# Patient Record
Sex: Female | Born: 1950 | Race: White | Hispanic: No | Marital: Single | Smoking: Former smoker
Health system: Southern US, Community
[De-identification: ages and names within clinical notes are randomized; demographics above are authoritative.]

## PROBLEM LIST (undated history)

## (undated) DIAGNOSIS — K2270 Barrett's esophagus without dysplasia: Secondary | ICD-10-CM

## (undated) DIAGNOSIS — F329 Major depressive disorder, single episode, unspecified: Secondary | ICD-10-CM

## (undated) DIAGNOSIS — F32A Depression, unspecified: Secondary | ICD-10-CM

## (undated) DIAGNOSIS — F419 Anxiety disorder, unspecified: Secondary | ICD-10-CM

## (undated) DIAGNOSIS — K219 Gastro-esophageal reflux disease without esophagitis: Secondary | ICD-10-CM

## (undated) DIAGNOSIS — M1990 Unspecified osteoarthritis, unspecified site: Secondary | ICD-10-CM

## (undated) DIAGNOSIS — G500 Trigeminal neuralgia: Secondary | ICD-10-CM

## (undated) DIAGNOSIS — T7840XA Allergy, unspecified, initial encounter: Secondary | ICD-10-CM

## (undated) HISTORY — DX: Barrett's esophagus without dysplasia: K22.70

## (undated) HISTORY — PX: UPPER GASTROINTESTINAL ENDOSCOPY: SHX188

## (undated) HISTORY — DX: Major depressive disorder, single episode, unspecified: F32.9

## (undated) HISTORY — PX: POLYPECTOMY: SHX149

## (undated) HISTORY — DX: Allergy, unspecified, initial encounter: T78.40XA

## (undated) HISTORY — DX: Unspecified osteoarthritis, unspecified site: M19.90

## (undated) HISTORY — PX: CARPAL TUNNEL RELEASE: SHX101

## (undated) HISTORY — PX: COLONOSCOPY: SHX174

## (undated) HISTORY — PX: OTHER SURGICAL HISTORY: SHX169

## (undated) HISTORY — DX: Anxiety disorder, unspecified: F41.9

## (undated) HISTORY — DX: Trigeminal neuralgia: G50.0

## (undated) HISTORY — DX: Gastro-esophageal reflux disease without esophagitis: K21.9

## (undated) HISTORY — DX: Depression, unspecified: F32.A

---

## 1999-04-28 ENCOUNTER — Other Ambulatory Visit: Admission: RE | Admit: 1999-04-28 | Discharge: 1999-04-28 | Payer: Self-pay | Admitting: Obstetrics and Gynecology

## 1999-06-24 ENCOUNTER — Encounter: Admission: RE | Admit: 1999-06-24 | Discharge: 1999-06-24 | Payer: Self-pay | Admitting: Obstetrics and Gynecology

## 1999-06-24 ENCOUNTER — Encounter: Payer: Self-pay | Admitting: Obstetrics and Gynecology

## 2000-05-02 ENCOUNTER — Other Ambulatory Visit: Admission: RE | Admit: 2000-05-02 | Discharge: 2000-05-02 | Payer: Self-pay | Admitting: Obstetrics and Gynecology

## 2000-06-28 ENCOUNTER — Encounter: Admission: RE | Admit: 2000-06-28 | Discharge: 2000-06-28 | Payer: Self-pay | Admitting: Obstetrics and Gynecology

## 2000-06-28 ENCOUNTER — Encounter: Payer: Self-pay | Admitting: Obstetrics and Gynecology

## 2001-07-04 ENCOUNTER — Encounter: Admission: RE | Admit: 2001-07-04 | Discharge: 2001-07-04 | Payer: Self-pay | Admitting: Obstetrics and Gynecology

## 2001-07-04 ENCOUNTER — Encounter: Payer: Self-pay | Admitting: Obstetrics and Gynecology

## 2002-07-22 ENCOUNTER — Encounter: Admission: RE | Admit: 2002-07-22 | Discharge: 2002-07-22 | Payer: Self-pay | Admitting: Obstetrics and Gynecology

## 2002-07-22 ENCOUNTER — Encounter: Payer: Self-pay | Admitting: Obstetrics and Gynecology

## 2003-07-24 ENCOUNTER — Encounter: Admission: RE | Admit: 2003-07-24 | Discharge: 2003-07-24 | Payer: Self-pay | Admitting: Obstetrics and Gynecology

## 2004-05-25 ENCOUNTER — Other Ambulatory Visit: Admission: RE | Admit: 2004-05-25 | Discharge: 2004-05-25 | Payer: Self-pay | Admitting: Internal Medicine

## 2004-08-16 ENCOUNTER — Encounter: Admission: RE | Admit: 2004-08-16 | Discharge: 2004-08-16 | Payer: Self-pay | Admitting: Obstetrics and Gynecology

## 2005-05-30 ENCOUNTER — Other Ambulatory Visit: Admission: RE | Admit: 2005-05-30 | Discharge: 2005-05-30 | Payer: Self-pay | Admitting: Internal Medicine

## 2005-08-11 ENCOUNTER — Ambulatory Visit: Payer: Self-pay | Admitting: Internal Medicine

## 2005-09-26 ENCOUNTER — Ambulatory Visit: Payer: Self-pay | Admitting: Internal Medicine

## 2005-09-26 ENCOUNTER — Encounter (INDEPENDENT_AMBULATORY_CARE_PROVIDER_SITE_OTHER): Payer: Self-pay | Admitting: *Deleted

## 2005-11-04 ENCOUNTER — Ambulatory Visit: Payer: Self-pay | Admitting: Internal Medicine

## 2006-10-23 ENCOUNTER — Encounter: Admission: RE | Admit: 2006-10-23 | Discharge: 2006-10-23 | Payer: Self-pay | Admitting: Family Medicine

## 2007-07-01 ENCOUNTER — Emergency Department: Payer: Self-pay | Admitting: Internal Medicine

## 2007-12-24 ENCOUNTER — Encounter: Admission: RE | Admit: 2007-12-24 | Discharge: 2007-12-24 | Payer: Self-pay | Admitting: Family Medicine

## 2009-03-27 DIAGNOSIS — E785 Hyperlipidemia, unspecified: Secondary | ICD-10-CM

## 2009-03-27 DIAGNOSIS — F329 Major depressive disorder, single episode, unspecified: Secondary | ICD-10-CM | POA: Insufficient documentation

## 2009-03-27 DIAGNOSIS — K219 Gastro-esophageal reflux disease without esophagitis: Secondary | ICD-10-CM

## 2009-03-27 DIAGNOSIS — Z860100 Personal history of colon polyps, unspecified: Secondary | ICD-10-CM | POA: Insufficient documentation

## 2009-03-27 DIAGNOSIS — K5730 Diverticulosis of large intestine without perforation or abscess without bleeding: Secondary | ICD-10-CM | POA: Insufficient documentation

## 2009-03-27 DIAGNOSIS — K2270 Barrett's esophagus without dysplasia: Secondary | ICD-10-CM

## 2009-04-02 ENCOUNTER — Ambulatory Visit: Payer: Self-pay | Admitting: Internal Medicine

## 2009-04-03 ENCOUNTER — Encounter: Admission: RE | Admit: 2009-04-03 | Discharge: 2009-04-03 | Payer: Self-pay | Admitting: Family Medicine

## 2009-04-08 ENCOUNTER — Telehealth: Payer: Self-pay | Admitting: Internal Medicine

## 2009-04-08 ENCOUNTER — Ambulatory Visit (HOSPITAL_COMMUNITY): Admission: RE | Admit: 2009-04-08 | Discharge: 2009-04-08 | Payer: Self-pay | Admitting: Internal Medicine

## 2009-04-10 ENCOUNTER — Ambulatory Visit: Payer: Self-pay | Admitting: Internal Medicine

## 2009-04-20 ENCOUNTER — Ambulatory Visit: Payer: Self-pay | Admitting: Internal Medicine

## 2009-04-23 ENCOUNTER — Ambulatory Visit: Payer: Self-pay | Admitting: Internal Medicine

## 2009-04-23 ENCOUNTER — Encounter: Payer: Self-pay | Admitting: Internal Medicine

## 2009-04-24 ENCOUNTER — Encounter: Payer: Self-pay | Admitting: Internal Medicine

## 2009-09-21 ENCOUNTER — Other Ambulatory Visit: Admission: RE | Admit: 2009-09-21 | Discharge: 2009-09-21 | Payer: Self-pay | Admitting: Family Medicine

## 2010-07-21 ENCOUNTER — Encounter
Admission: RE | Admit: 2010-07-21 | Discharge: 2010-07-21 | Payer: Self-pay | Source: Home / Self Care | Attending: Family Medicine | Admitting: Family Medicine

## 2010-10-27 ENCOUNTER — Encounter: Payer: Self-pay | Admitting: Internal Medicine

## 2010-11-04 NOTE — Letter (Signed)
Summary: Endo/Colon Letter  [facility]  [address] [place], [place] [address]   Phone: [phone]  Fax: [phone]      October 27, 2010 MRN: [id number]   [facility] [address] [place], [place]  [address]   Dear Ms. DUFFY,   According to your medical record, it is time for you to schedule an Endoscopy/Colonoscopy . Endoscopic screeening is recommended for patients with certain upper digestive tract conditions because of associated increased risk for cancers of the upper digestive system. The American Cancer Society recommends Colonoscopy as a method to detect early colon cancer. Patients with a family history of colon cancer, or a personal history of colon polyps or inflammatory bowel disease are at increased risk.  This letter has been generated based on the recommendations made at the time of your prior procedure. If you feel that in your particular situation this may no longer apply, please contact our office.  Please call our office at [phone] to schedule this appointment or to update your records at your earliest convenience.  Thank you for cooperating with [place] to provide you with the very best care possible.   Sincerely,  Hedwig Morton. Juanda Chance, M.D.  [facility] [phone]

## 2011-08-16 ENCOUNTER — Other Ambulatory Visit: Payer: Self-pay | Admitting: Family Medicine

## 2011-08-16 DIAGNOSIS — Z1231 Encounter for screening mammogram for malignant neoplasm of breast: Secondary | ICD-10-CM

## 2011-08-26 ENCOUNTER — Ambulatory Visit: Payer: Self-pay

## 2011-09-02 ENCOUNTER — Ambulatory Visit
Admission: RE | Admit: 2011-09-02 | Discharge: 2011-09-02 | Disposition: A | Discharge status: Home / Self Care | Payer: 59 | Source: Ambulatory Visit | Attending: Family Medicine | Admitting: Family Medicine

## 2011-09-02 DIAGNOSIS — Z1231 Encounter for screening mammogram for malignant neoplasm of breast: Secondary | ICD-10-CM

## 2011-10-03 ENCOUNTER — Encounter: Payer: Self-pay | Admitting: Internal Medicine

## 2011-11-11 ENCOUNTER — Encounter: Payer: 59 | Admitting: Internal Medicine

## 2011-12-21 ENCOUNTER — Encounter: Payer: 59 | Admitting: Internal Medicine

## 2012-01-10 ENCOUNTER — Ambulatory Visit (AMBULATORY_SURGERY_CENTER): Payer: 59

## 2012-01-10 ENCOUNTER — Encounter: Payer: Self-pay | Admitting: Internal Medicine

## 2012-01-10 VITALS — Ht 66.0 in | Wt 214.3 lb

## 2012-01-10 DIAGNOSIS — Z1211 Encounter for screening for malignant neoplasm of colon: Secondary | ICD-10-CM

## 2012-01-10 DIAGNOSIS — Z86010 Personal history of colonic polyps: Secondary | ICD-10-CM

## 2012-01-10 MED ORDER — PEG-KCL-NACL-NASULF-NA ASC-C 100 G PO SOLR: 1.0000 | Freq: Once | ORAL | Status: AC

## 2012-01-24 ENCOUNTER — Encounter: Payer: 59 | Admitting: Internal Medicine

## 2012-03-28 ENCOUNTER — Telehealth: Payer: Self-pay | Admitting: Internal Medicine

## 2012-03-28 ENCOUNTER — Encounter: Payer: 59 | Admitting: Internal Medicine

## 2012-04-25 ENCOUNTER — Encounter: Payer: Self-pay | Admitting: Internal Medicine

## 2012-09-10 ENCOUNTER — Other Ambulatory Visit: Payer: Self-pay | Admitting: Family Medicine

## 2012-09-10 DIAGNOSIS — Z1231 Encounter for screening mammogram for malignant neoplasm of breast: Secondary | ICD-10-CM

## 2012-10-19 ENCOUNTER — Ambulatory Visit: Payer: 59

## 2012-11-22 ENCOUNTER — Encounter: Payer: Self-pay | Admitting: Internal Medicine

## 2013-02-01 ENCOUNTER — Encounter: Payer: Self-pay | Admitting: Neurology

## 2013-02-01 DIAGNOSIS — F329 Major depressive disorder, single episode, unspecified: Secondary | ICD-10-CM

## 2013-02-01 DIAGNOSIS — M1990 Unspecified osteoarthritis, unspecified site: Secondary | ICD-10-CM | POA: Insufficient documentation

## 2013-02-01 DIAGNOSIS — F419 Anxiety disorder, unspecified: Secondary | ICD-10-CM

## 2013-02-01 DIAGNOSIS — K219 Gastro-esophageal reflux disease without esophagitis: Secondary | ICD-10-CM | POA: Insufficient documentation

## 2013-02-04 ENCOUNTER — Encounter: Payer: Self-pay | Admitting: Neurology

## 2013-02-04 ENCOUNTER — Ambulatory Visit (INDEPENDENT_AMBULATORY_CARE_PROVIDER_SITE_OTHER): Payer: 59 | Admitting: Neurology

## 2013-02-04 VITALS — BP 135/85 | HR 68 | Ht 66.0 in | Wt 212.0 lb

## 2013-02-04 DIAGNOSIS — G500 Trigeminal neuralgia: Secondary | ICD-10-CM

## 2013-02-04 MED ORDER — CARBAMAZEPINE 200 MG PO TABS: 200.0000 mg | ORAL_TABLET | Freq: Two times a day (BID) | ORAL | Status: DC

## 2013-02-04 NOTE — Progress Notes (Signed)
[facility]  PATIENT: RASHAUNDA RAHL DOB: 05/09/1951  HISTORICAL Davey is a 62 years old right-handed Caucasian female, referred by her primary care physician Dr Maurice Small for evaluation of left trigeminal neuralgia.  She had a past medical history of hyperlipidemia, suffered left trigeminal neuralgia since 2008, she was treated at [facility] neurologist by Dr. Clint Lipps, she reported severe shooting pain along the left chin, in the left V3 distribution, initial episode lasted about one week, it was controlled by carbamazepine 100 mg twice a day, the pain was so severe, she presented to the emergency room   While she was taking carbamazepine100 mg twice a day, she had a second episodes of flareup 18 months later in 2009, her symptoms was be able to be quickly brought under control, once the carbamazepine dosage was increased to 200 mg twice a day,  She has been doing very well on current dose of carbamazepine regular form 200 mg twice a day, there was no recurrent symptoms, but because the severity of the pain, patient is concerned that it might come back   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 aspirin 81 MG tablet Take 81 mg by mouth daily.      . calcium-vitamin D (OSCAL WITH D) 500-200 MG-UNIT per tablet Take 2 tablets by mouth 2 (two) times daily.      . cetirizine (ZYRTEC) 10 MG tablet Take 10 mg by mouth daily.      Marland Kitchen escitalopram (LEXAPRO) 20 MG tablet Take 20 mg by mouth daily.      Marland Kitchen loratadine (CLARITIN) 10 MG tablet Take 10 mg by mouth daily.      . Multiple Vitamin  (MULTIVITAMIN) tablet Take 1 tablet by mouth daily.      Marland Kitchen omeprazole (PRILOSEC) 40 MG capsule Take 40 mg by mouth 2 (two) times daily.      . carbamazepine (TEGRETOL) 200 MG tablet Take 200 mg by mouth 2 (two) times daily.       No facility-administered medications prior to visit.    PAST MEDICAL HISTORY: Past Medical History  Diagnosis Date  . GERD (gastroesophageal reflux disease)   . Arthritis   . Trigeminal neuralgia     left side  . Barrett's esophagus   . Depression   . Anxiety     PAST SURGICAL HISTORY: Past Surgical History  Procedure Laterality Date  . Carpal tunnel release      right hand  . Colonoscopy    . Upper gastrointestinal endoscopy    .  bone spur surg       on left foot /also toe surg     FAMILY HISTORY: Family History  Problem Relation Age of Onset  . Heart disease Father   . Diabetes Father   . Lung cancer Father   . Glaucoma    . Arthritis    . Arrhythmia    . Fibromyalgia Sister   . Bipolar disorder Sister     SOCIAL HISTORY:  History   Social History  . Marital Status: Single    Spouse Name: N/A    Number of Children: 0  .  Years of Education: college   Occupational History  .  Lab [employer]   Social History Main Topics  . Smoking status: Former Smoker    Quit date: 01/09/1982  . Smokeless tobacco: Never Used  . Alcohol Use: 1.8 - 2.4 oz/week    3-4 Glasses of wine per week     Comment: Weekends  . Drug Use: No  . Sexually Active: Not on file   Other Topics Concern  . Not on file   Social History Narrative   Patient is single and lives with her dog.Patient works for [employer].   Caffeine. 2 cups daily.   Right handed.     PHYSICAL EXAM    Filed Vitals:   02/04/13 0826  BP: 135/85  Pulse: 68  Height: 5\' 6"  (1.676 m)  Weight: 212 lb (96.163 kg)    Not recorded    Body mass index is 34.23 kg/(m^2).   Generalized: In no acute distress  Neck: Supple, no carotid bruits   Cardiac: Regular rate  rhythm  Pulmonary: Clear to auscultation bilaterally  Musculoskeletal: No deformity  Neurological examination  Mentation: Alert oriented to time, place, history taking, and causual conversation  Cranial nerve II-XII: Pupils were equal round reactive to light extraocular movements were full, visual field were full on confrontational test. facial sensation and strength were normal. hearing was intact to finger rubbing bilaterally. Uvula tongue midline.  head turning and shoulder shrug and were normal and symmetric.Tongue protrusion into cheek strength was normal.  Motor: She has mild toe flexion, extension weakness.  Sensory: Intact to light touch, pinprick, vibratory sensation,  Coordination: Normal finger to nose, heel-to-shin bilaterally there was no truncal ataxia  Gait: Rising up from seated position without assistance, normal stance, without trunk ataxia, moderate stride, good arm swing, smooth turning, able to perform tiptoe, and heel walking without difficulty.   Romberg signs: positive.  Deep tendon reflexes: Brachioradialis 2/2, biceps 2/2, triceps 2/2, patellar 2/2, Achilles 2/2 plantar responses were flexor bilaterally.    ASSESSMENT AND PLAN   62 years old right-handed Caucasian female, with history of left trigeminal neuralgia, involving left V3 branch, Doing very well with current dose of carbamazepine 200 mg twice a day, will refill her medications, I also advised her, if she continues to be symptom-free, may consider tapering off carbamazepine, call office for recurrent symptoms,    Levert Feinstein, M.D. Ph.D.  [facility] [address], [address] [place], [place] [address] [phone]

## 2014-01-10 ENCOUNTER — Other Ambulatory Visit: Payer: Self-pay | Admitting: Family Medicine

## 2014-01-10 ENCOUNTER — Other Ambulatory Visit (HOSPITAL_COMMUNITY)
Admission: RE | Admit: 2014-01-10 | Discharge: 2014-01-10 | Disposition: A | Discharge status: Home / Self Care | Payer: 59 | Source: Ambulatory Visit | Attending: Family Medicine | Admitting: Family Medicine

## 2014-01-10 DIAGNOSIS — Z124 Encounter for screening for malignant neoplasm of cervix: Secondary | ICD-10-CM | POA: Insufficient documentation

## 2014-01-14 LAB — CYTOLOGY - PAP

## 2014-05-27 ENCOUNTER — Other Ambulatory Visit: Payer: Self-pay

## 2014-05-27 DIAGNOSIS — Z1231 Encounter for screening mammogram for malignant neoplasm of breast: Secondary | ICD-10-CM

## 2014-06-12 ENCOUNTER — Emergency Department: Payer: Self-pay | Admitting: Internal Medicine

## 2014-06-20 ENCOUNTER — Ambulatory Visit: Payer: 59

## 2014-07-18 ENCOUNTER — Ambulatory Visit: Payer: 59

## 2014-08-06 ENCOUNTER — Ambulatory Visit: Payer: 59

## 2014-08-20 ENCOUNTER — Ambulatory Visit
Admission: RE | Admit: 2014-08-20 | Discharge: 2014-08-20 | Disposition: A | Discharge status: Home / Self Care | Payer: 59 | Source: Ambulatory Visit

## 2014-08-20 DIAGNOSIS — Z1231 Encounter for screening mammogram for malignant neoplasm of breast: Secondary | ICD-10-CM
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
## 2015-09-08 ENCOUNTER — Encounter: Payer: Self-pay | Admitting: Internal Medicine

## 2016-04-20 ENCOUNTER — Encounter: Payer: Self-pay | Admitting: Gastroenterology

## 2016-05-02 ENCOUNTER — Encounter: Payer: Self-pay | Admitting: Gastroenterology

## 2016-05-02 ENCOUNTER — Telehealth: Payer: Self-pay | Admitting: *Deleted

## 2016-05-02 ENCOUNTER — Ambulatory Visit (AMBULATORY_SURGERY_CENTER): Payer: Self-pay | Admitting: *Deleted

## 2016-05-02 VITALS — Ht 65.5 in | Wt 226.0 lb

## 2016-05-02 DIAGNOSIS — Z86010 Personal history of colonic polyps: Secondary | ICD-10-CM

## 2016-05-02 MED ORDER — NA SULFATE-K SULFATE-MG SULF 17.5-3.13-1.6 GM/177ML PO SOLN: 1.0000 | Freq: Once | ORAL | 0 refills | Status: AC

## 2016-05-02 NOTE — Telephone Encounter (Signed)
Dr Myrtie Neitheranis,  I saw this lady today in [place]. She is scheduled for a colon with you 10-9- Monday for a hx of colon polyps- prev. Brodie pt.  She also has a hx of Barrett's- she had her last egd 04-23-2009 with Barrett's with a 3 yr recall.  She was only scheduled for a recall colon.  She takes omeprazole and states no new issues . Do you want to add egd to her colon and change her date or would you like her to have an OV  Please advise, thanks for your time, Elizebeth BrookingMarie [place]

## 2016-05-02 NOTE — Progress Notes (Signed)
No egg or soy allergy known to patient  No issues with past sedation with any surgeries  or procedures, no intubation problems  No diet pills per patient No home 02 use per patient  No blood thinners per patient  Pt states  issues with constipation Daily-- doesnt use OTC meds -stools are soft at times, hard at other times- No A fib or A flutter

## 2016-05-02 NOTE — Telephone Encounter (Signed)
Yes, thank you for picking that up. I reviewed the EGD and pathology reports from 2010, and she does appear to have very short segment Barrett's esophagus.  If the schedule allows, please add an EGD to her planned procedures that day and inform her.

## 2016-05-03 NOTE — Telephone Encounter (Signed)
Informed pt egd added per Dr Myrtie Neitheranis-- per natalie, added egd to colon 05-16-16 at 1130, she blocked his 10 am so no additional proc then to add the egd for pt 10-9.  Pt aware  Elizebeth BrookingMarie [place]

## 2016-05-16 ENCOUNTER — Ambulatory Visit (AMBULATORY_SURGERY_CENTER): Payer: 59 | Admitting: Gastroenterology

## 2016-05-16 ENCOUNTER — Encounter: Payer: Self-pay | Admitting: Gastroenterology

## 2016-05-16 VITALS — BP 102/68 | HR 62 | Temp 97.7°F | Resp 14 | Ht 65.5 in | Wt 226.0 lb

## 2016-05-16 DIAGNOSIS — Z86010 Personal history of colonic polyps: Secondary | ICD-10-CM | POA: Diagnosis not present

## 2016-05-16 DIAGNOSIS — K2270 Barrett's esophagus without dysplasia: Secondary | ICD-10-CM | POA: Diagnosis not present

## 2016-05-16 MED ORDER — SODIUM CHLORIDE 0.9 % IV SOLN: 500.0000 mL | INTRAVENOUS | Status: AC

## 2016-05-16 NOTE — Progress Notes (Signed)
Called to room to assist during endoscopic procedure.  Patient ID and intended procedure confirmed with present staff. Received instructions for my participation in the procedure from the performing physician.  

## 2016-05-16 NOTE — Op Note (Signed)
[facility] Patient Name: Destiny Wilson Procedure Date: 05/16/2016 11:03 AM MRN: [id number] Endoscopist: Sherilyn Cooter L. Myrtie Neither , MD Age: 65 Referring MD:  Date of Birth: 01-27-1951 Gender: Female Account #: [id number] Procedure:                Colonoscopy Indications:              Surveillance: Personal history of adenomatous                            polyps on last colonoscopy 09/2005 Medicines:                Monitored Anesthesia Care Procedure:                Pre-Anesthesia Assessment:                           - Prior to the procedure, a History and Physical                            was performed, and patient medications and                            allergies were reviewed. The patient's tolerance of                            previous anesthesia was also reviewed. The risks                            and benefits of the procedure and the sedation                            options and risks were discussed with the patient.                            All questions were answered, and informed consent                            was obtained. Anticoagulants: The patient has taken                            aspirin. It was decided not to withhold this                            medication prior to the procedure. ASA Grade                            Assessment: II - A patient with mild systemic                            disease. After reviewing the risks and benefits,                            the patient was deemed in satisfactory condition to  undergo the procedure.                           - Prior to the procedure, a History and Physical                            was performed, and patient medications and                            allergies were reviewed. The patient's tolerance of                            previous anesthesia was also reviewed. The risks                            and benefits of the procedure and the sedation             options and risks were discussed with the patient.                            All questions were answered, and informed consent                            was obtained. Anticoagulants: The patient has taken                            aspirin. It was decided not to withhold this                            medication prior to the procedure. ASA Grade                            Assessment: II - A patient with mild systemic                            disease. After reviewing the risks and benefits,                            the patient was deemed in satisfactory condition to                            undergo the procedure.                           After obtaining informed consent, the colonoscope                            was passed under direct vision. Throughout the                            procedure, the patient's blood pressure, pulse, and                            oxygen saturations were monitored continuously. The  Model CF-[id number]) scope was introduced                            through the anus and advanced to the the cecum,                            identified by appendiceal orifice and ileocecal                            valve. The colonoscopy was performed without                            difficulty. The patient tolerated the procedure                            well. The quality of the bowel preparation was                            excellent. The ileocecal valve, appendiceal                            orifice, and rectum were photographed. The quality                            of the bowel preparation was evaluated using the                            BBPS [facility]([place] Bowel Preparation Scale) with scores                            of: Right Colon = 2, Transverse Colon = 3 and Left                            Colon = 3. The total BBPS score equals 8. The bowel                            preparation used was SUPREP. Scope In:  11:25:27 AM Scope Out: 11:44:55 AM Scope Withdrawal Time: 0 hours 8 minutes 43 seconds  Total Procedure Duration: 0 hours 19 minutes 28 seconds  Findings:                 The perianal and digital rectal examinations were                            normal.                           Many large-mouthed diverticula were found in the                            entire colon.                           The exam was otherwise without abnormality on  direct and retroflexion views. Complications:            No immediate complications. Estimated Blood Loss:     Estimated blood loss: none. Impression:               - Diverticulosis in the entire examined colon.                           - The examination was otherwise normal on direct                            and retroflexion views.                           - No specimens collected. Recommendation:           - Patient has a contact number available for                            emergencies. The signs and symptoms of potential                            delayed complications were discussed with the                            patient. Return to normal activities tomorrow.                            Written discharge instructions were provided to the                            patient.                           - Resume previous diet.                           - Continue present medications.                           - Repeat colonoscopy in 10 years for surveillance. Henry L. Myrtie Neither, MD 05/16/2016 11:53:52 AM This report has been signed electronically.

## 2016-05-16 NOTE — Progress Notes (Signed)
Pt ate 2 eggs and 1/2 a piece of toast yesterday am at 0600.  maw

## 2016-05-16 NOTE — Op Note (Signed)
[facility] Patient Name: Destiny Wilson Procedure Date: 05/16/2016 11:03 AM MRN: [id number] Endoscopist: Sherilyn Cooter L. Myrtie Neither , MD Age: 65 Referring MD:  Date of Birth: 05/18/1951 Gender: Female Account #: [id number] Procedure:                Upper GI endoscopy Indications:              Surveillance for malignancy due to personal history                            of Barrett's esophagus Medicines:                Monitored Anesthesia Care Procedure:                Pre-Anesthesia Assessment:                           - Prior to the procedure, a History and Physical                            was performed, and patient medications and                            allergies were reviewed. The patient's tolerance of                            previous anesthesia was also reviewed. The risks                            and benefits of the procedure and the sedation                            options and risks were discussed with the patient.                            All questions were answered, and informed consent                            was obtained. Anticoagulants: The patient has taken                            aspirin. It was decided not to withhold this                            medication prior to the procedure. ASA Grade                            Assessment: II - A patient with mild systemic                            disease. After reviewing the risks and benefits,                            the patient was deemed in satisfactory condition to  undergo the procedure.                           After obtaining informed consent, the endoscope was                            passed under direct vision. Throughout the                            procedure, the patient's blood pressure, pulse, and                            oxygen saturations were monitored continuously. The                            Model GIF-HQ190 [id number](SN#[id number]) scope was introduced                        through the mouth, and advanced to the second part                            of duodenum. The upper GI endoscopy was                            accomplished without difficulty. The patient                            tolerated the procedure well. Scope In: Scope Out: Findings:                 There were esophageal mucosal changes secondary to                            established short-segment Barrett's disease present                            in the lower third of the esophagus. The maximum                            longitudinal extent of these mucosal changes was 1                            cm in length. Mucosa was biopsied with a cold                            forceps for histology in a targeted manner in the                            lower third of the esophagus. One specimen bottle                            was sent to pathology.                           A few sessile fundic gland polyps with  no stigmata                            of recent bleeding were found in the gastric fundus                            and on the greater curvature of the gastric body.                           The cardia and gastric fundus were normal on                            retroflexion.                           The examined duodenum was normal. Complications:            No immediate complications. Estimated Blood Loss:     Estimated blood loss: none. Impression:               - Esophageal mucosal changes secondary to                            established short-segment Barrett's disease.                            Biopsied.                           - A few fundic gland polyps. Benign and commonly                            seen with chronic PPI therapy.                           - Normal examined duodenum. Recommendation:           - Patient has a contact number available for                            emergencies. The signs and symptoms of potential                             delayed complications were discussed with the                            patient. Return to normal activities tomorrow.                            Written discharge instructions were provided to the                            patient.                           - Resume previous diet.                           -  Continue present medications.                           - Await pathology results.                           - Repeat upper endoscopy for surveillance based on                            pathology results. [name] L. Myrtie Neither, MD 05/16/2016 11:50:32 AM This report has been signed electronically.

## 2016-05-16 NOTE — Progress Notes (Signed)
Pt was rubbing her right calf after she got dressed.  I asked if she was ok.  Pt replied, "my leg feels like it has a cramp in it".  No redness or swelling noted. maw

## 2016-05-16 NOTE — Patient Instructions (Signed)
Handouts given : Diverticulosis, Barrett's.   YOU HAD AN ENDOSCOPIC PROCEDURE TODAY AT THE [facility]:   Refer to the procedure report that was given to you for any specific questions about what was found during the examination.  If the procedure report does not answer your questions, please call your gastroenterologist to clarify.  If you requested that your care partner not be given the details of your procedure findings, then the procedure report has been included in a sealed envelope for you to review at your convenience later.  YOU SHOULD EXPECT: Some feelings of bloating in the abdomen. Passage of more gas than usual.  Walking can help get rid of the air that was put into your GI tract during the procedure and reduce the bloating. If you had a lower endoscopy (such as a colonoscopy or flexible sigmoidoscopy) you may notice spotting of blood in your stool or on the toilet paper. If you underwent a bowel prep for your procedure, you may not have a normal bowel movement for a few days.  Please Note:  You might notice some irritation and congestion in your nose or some drainage.  This is from the oxygen used during your procedure.  There is no need for concern and it should clear up in a day or so.  SYMPTOMS TO REPORT IMMEDIATELY:   Following lower endoscopy (colonoscopy or flexible sigmoidoscopy):  Excessive amounts of blood in the stool  Significant tenderness or worsening of abdominal pains  Swelling of the abdomen that is new, acute  Fever of 100F or higher   Following upper endoscopy (EGD)  Vomiting of blood or coffee ground material  New chest pain or pain under the shoulder blades  Painful or persistently difficult swallowing  New shortness of breath  Fever of 100F or higher  Black, tarry-looking stools  For urgent or emergent issues, a gastroenterologist can be reached at any hour by calling [phone].   DIET:  We do recommend a small meal at first, but  then you may proceed to your regular diet.  Drink plenty of fluids but you should avoid alcoholic beverages for 24 hours.  ACTIVITY:  You should plan to take it easy for the rest of today and you should NOT DRIVE or use heavy machinery until tomorrow (because of the sedation medicines used during the test).    FOLLOW UP: Our staff will call the number listed on your records the next business day following your procedure to check on you and address any questions or concerns that you may have regarding the information given to you following your procedure. If we do not reach you, we will leave a message.  However, if you are feeling well and you are not experiencing any problems, there is no need to return our call.  We will assume that you have returned to your regular daily activities without incident.  If any biopsies were taken you will be contacted by phone or by letter within the next 1-3 weeks.  Please call us at [phone] if you have not heard about the biopsies in 3 weeks.    SIGNATURES/CONFIDENTIALITY: You and/or your care partner have signed paperwork which will be entered into your electronic medical record.  These signatures attest to the fact that that the information above on your After Visit Summary has been reviewed and is understood.  Full responsibility of the confidentiality of this discharge information lies with you and/or your care-partner.

## 2016-05-16 NOTE — Progress Notes (Signed)
No problems noted in the recovery room. maw 

## 2016-05-16 NOTE — Progress Notes (Signed)
Report to PACU, RN, vss, BBS= Clear.  

## 2016-05-17 ENCOUNTER — Telehealth: Payer: Self-pay

## 2016-05-17 NOTE — Telephone Encounter (Signed)
Left a message at [phone] for the pt to call us back if she has any questions or concerns. maw

## 2016-05-17 NOTE — Telephone Encounter (Signed)
  Follow up Call-  Call back number 05/16/2016  Post procedure Call Back phone  # #[phone]  Permission to leave phone message Yes  Some recent data might be hidden    Patient was called for follow up after her procedure on 05/16/2016. No answer at the number given for follow up phone call. A message was left on the answering machine. This was the second attempt to contact the patient.

## 2016-05-20 ENCOUNTER — Encounter: Payer: Self-pay | Admitting: Gastroenterology

## 2017-04-17 ENCOUNTER — Ambulatory Visit: Payer: 59 | Admitting: Podiatry

## 2017-04-20 ENCOUNTER — Encounter: Payer: Self-pay | Admitting: Podiatry

## 2017-04-20 ENCOUNTER — Ambulatory Visit (INDEPENDENT_AMBULATORY_CARE_PROVIDER_SITE_OTHER): Payer: 59

## 2017-04-20 ENCOUNTER — Ambulatory Visit (INDEPENDENT_AMBULATORY_CARE_PROVIDER_SITE_OTHER): Payer: 59 | Admitting: Podiatry

## 2017-04-20 DIAGNOSIS — M779 Enthesopathy, unspecified: Secondary | ICD-10-CM

## 2017-04-20 DIAGNOSIS — M898X9 Other specified disorders of bone, unspecified site: Secondary | ICD-10-CM

## 2017-04-20 DIAGNOSIS — S93402A Sprain of unspecified ligament of left ankle, initial encounter: Secondary | ICD-10-CM

## 2017-04-20 DIAGNOSIS — M21629 Bunionette of unspecified foot: Secondary | ICD-10-CM

## 2017-04-20 DIAGNOSIS — M19172 Post-traumatic osteoarthritis, left ankle and foot: Secondary | ICD-10-CM

## 2017-04-20 NOTE — Progress Notes (Signed)
Subjective:    Patient ID: Destiny Wilson, female   DOB: 66 y.o.   MRN: [id number]   HPI 66 year old female percent the office today for multiple concerns. She states that the majority of her symptoms are to the right foot. She is a large bone spur on top of her right foot that should have cut out as well as for painful tailor's bunion the right foot. So than on ongoing issues she's tried numerous conservative treatments including shoe gear modifications, offloading and padding without any significant improvement and she is requesting surgical intervention for both these issues. She has a history of a previous exostectomy at the dorsal aspect of the left midfoot couple years back and she did well. She also states that she gets pain to the outside aspect of the left ankle which is been ongoing the last several months. She has a history of a ankle fracture in 2011 that did not require surgery. She gets some pain in the outside aspect of her ankle with walking but she is not having any pain today she is on steroids for her poison ivy. She has no other concerns today.   Review of Systems  All other systems reviewed and are negative.       Objective:  Physical Exam General: AAO x3, NAD  Dermatological: Skin is warm, dry and supple bilateral. Nails x 10 are well manicured; remaining integument appears unremarkable at this time. There are no open sores, no preulcerative lesions, no rash or signs of infection present.  Vascular: Dorsalis Pedis artery and Posterior Tibial artery pedal pulses are 2/4 bilateral with immedate capillary fill time. There is no pain with calf compression, swelling, warmth, erythema.   Neruologic: Grossly intact via light touch bilateral. Protective threshold with Semmes Wienstein monofilament intact to all pedal sites bilateral.   Musculoskeletal: On the lateral aspect of the ankle subjective she gets discomfort lateral ankle but there is no area of tenderness identified  today. There is no area pinpoint bony tenderness or pain the vibratory sensation. No pain on the lateral ankle ligaments, syndesmosis or deltoid ligaments. There is no pain of the fifth metatarsal base, talus. There is no swelling to the left ankle or foot. On the right side there is a large bony exostosis tenderness to palpation there is erythema from irritation shoes. There is a large prominence of the lateral aspect of the fifth metatarsal head consistent with a significant tailor's bunion there is again erythema from irritation shoes and is tenderness palpation in this area. There is cavus foot type identified bilaterally. There is no other area of tenderness of the foot bilaterally. MMT 5/5.  Gait: Unassisted, Nonantalgic.      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 On the right foot there is a large bony exostosis present off the first metatarsal cuneiform joint as well as tailor's bunion present. On the left ankle there is mild arthritis of the lateral ankle. There is no evidence of acute fracture identified.  1. Left lateral ankle pain -Results of mild arthritis as well as foot type. Recommend custom orthotics. She has inserts at home which help her foot. She had  no pain today she is on steroids. We'll continue with change in shoes as well as orthotics  2. Symptomatic dorsal exostosis right foot and tailor's bunion -When long discussion regards to both conservative and surgical treatment options. This point she wishes to proceed with surgical intervention. Discussed options and she states that she does not want to do a correction of the tailor's bunion she  wants to have the bone removed, fifth metatarsal head excision. She requested this procedure today. -Will do dorsal exostectomy with fifth metatarsal head excision right foot. -The incision placement as well as the postoperative course was discussed with the patient. I discussed risks of the surgery which include, but not limited to, infection, bleeding, pain, swelling, need for further surgery, transfer lesions, delayed or nonhealing, painful or ugly scar, numbness or sensation changes, over/under correction, recurrence, transfer lesions, further deformity, hardware failure, DVT/PE, loss of toe/foot. Patient understands these risks and wishes to proceed with surgery. The surgical consent was reviewed with the patient all 3 pages were signed. No promises or guarantees were given to the outcome of the procedure. All questions were answered to the best of my ability. Before the surgery the patient was encouraged to call the office if there is any further questions. [facility].  CAM boot dispensed for postop.  Ovid CurdMatthew Laela Deviney, DPM

## 2017-04-20 NOTE — Patient Instructions (Signed)

## 2017-04-26 ENCOUNTER — Encounter: Payer: Self-pay | Admitting: Podiatry

## 2017-04-26 DIAGNOSIS — M21541 Acquired clubfoot, right foot: Secondary | ICD-10-CM | POA: Diagnosis not present

## 2017-04-26 DIAGNOSIS — M25774 Osteophyte, right foot: Secondary | ICD-10-CM | POA: Diagnosis not present

## 2017-04-26 NOTE — Progress Notes (Signed)
Pre-operative Note  Patient presents to the [facility] today for surgical intervention of the right foot for surgical excision of a painful bone spur on the top of the foot and for a large, painful tailors bunion. The surgical consent was reviewed with the patient and we discussed the procedure as well as the postoperative course. Will plan for exostectomy and 5th metatarsal head excision. She states she did not want to undergo realignment of the metatarsal and she did not want plates or screws in her foot and she actually came in to the office requesting a metatarsal head excision. I again discussed all alternatives, risks, complications. I answered all of their questions to the best of my ability and they wish to proceed with surgery. No promises or guarantees were given as to the outcome of the surgery.   The surgical consent was signed.   Patient is NPO since midnight.  The patient does not have have a history of blood clots or bleeding disorders.   No further questions.   Ovid Curd, DPM [facility]

## 2017-04-28 ENCOUNTER — Telehealth: Payer: Self-pay | Admitting: *Deleted

## 2017-04-28 NOTE — Telephone Encounter (Signed)
Called and left a message to call back and was checking on patient after surgery. Misty Stanley

## 2017-05-01 ENCOUNTER — Encounter: Payer: Self-pay | Admitting: Podiatry

## 2017-05-01 ENCOUNTER — Ambulatory Visit (INDEPENDENT_AMBULATORY_CARE_PROVIDER_SITE_OTHER): Payer: 59

## 2017-05-01 ENCOUNTER — Ambulatory Visit (INDEPENDENT_AMBULATORY_CARE_PROVIDER_SITE_OTHER): Payer: 59 | Admitting: Podiatry

## 2017-05-01 DIAGNOSIS — M898X9 Other specified disorders of bone, unspecified site: Secondary | ICD-10-CM

## 2017-05-01 DIAGNOSIS — M21629 Bunionette of unspecified foot: Secondary | ICD-10-CM

## 2017-05-02 ENCOUNTER — Other Ambulatory Visit: Payer: Self-pay | Admitting: Family Medicine

## 2017-05-02 DIAGNOSIS — Z1231 Encounter for screening mammogram for malignant neoplasm of breast: Secondary | ICD-10-CM

## 2017-05-03 NOTE — Progress Notes (Signed)
Subjective: Destiny Wilson is a 66 y.o. is seen today in office s/p right foot dorsal exostectomy and 5th metatarsal head excision preformed on 04/26/2017 for POV #1. They state their pain is controlled and she is not taking any pain medication. She has remained in a cam boot. She has no concerns today. Denies any systemic complaints such as fevers, chills, nausea, vomiting. No calf pain, chest pain, shortness of breath.   Objective: General: No acute distress, AAOx3  DP/PT pulses palpable 2/4, CRT < 3 sec to all digits.  Protective sensation intact. Motor function intact.  Right foot: Incision is well coapted without any evidence of dehiscence and sutures are intact. There is no surrounding erythema, ascending cellulitis, fluctuance, crepitus, malodor, drainage/purulence. There is mild edema around the surgical site. There is no pain along the surgical site. There is significant improvement in the prominence that she was having prior to surgery she is happy with the outcome so far. No other areas of tenderness to bilateral lower extremities.  No other open lesions or pre-ulcerative lesions.  No pain with calf compression, swelling, warmth, erythema.   Assessment and Plan:  Status post right foot surgery, doing well with no complications   -Treatment options discussed including all alternatives, risks, and complications -X-rays were obtained and reviewed with the patient. Status post exostectomy at the dorsal aspect of the midfoot as well as fifth metatarsal head excision. There is no evidence of acute fracture identified -Antibiotic later was reapplied along the incision followed by dry sterile dressing. Keep the dressing clean, dry, intact -Cam boot at all times. -Ice/elevation -Pain medication as needed. -Monitor for any clinical signs or symptoms of infection and DVT/PE and directed to call the office immediately should any occur or go to the ER. -Follow-up in 10 days for suture removal  possibly or sooner if any problems arise. In the meantime, encouraged to call the office with any questions, concerns, change in symptoms.   Ovid Curd, DPM

## 2017-05-08 ENCOUNTER — Encounter: Payer: 59 | Admitting: Podiatry

## 2017-05-16 ENCOUNTER — Encounter: Payer: Self-pay | Admitting: Podiatry

## 2017-05-16 ENCOUNTER — Ambulatory Visit (INDEPENDENT_AMBULATORY_CARE_PROVIDER_SITE_OTHER): Payer: 59 | Admitting: Podiatry

## 2017-05-16 DIAGNOSIS — M21621 Bunionette of right foot: Secondary | ICD-10-CM

## 2017-05-16 DIAGNOSIS — M898X9 Other specified disorders of bone, unspecified site: Secondary | ICD-10-CM

## 2017-05-16 NOTE — Progress Notes (Signed)
DOS 04/26/17 Rt foot surgical removal of bone spur top of foot, removal of 5th metatarsal head for tailors bunion Rt

## 2017-05-17 ENCOUNTER — Ambulatory Visit: Payer: 59

## 2017-05-17 NOTE — Progress Notes (Signed)
Subjective: Destiny Wilson is a 66 y.o. is seen today in office s/p right foot dorsal exostectomy and 5th metatarsal head excision preformed on 04/26/2017 for POV #2. She states that overall she is doing well. She didn't get tired wearing the cam this issue and into a regular shoe last week although loosely. She still gets some soreness and swelling to the surgical sites that overall she is doing well. She has no concerns and she is very happy with the outcome of the surgery so far. Denies any systemic complaints such as fevers, chills, nausea, vomiting. No calf pain, chest pain, shortness of breath.   Objective: General: No acute distress, AAOx3  DP/PT pulses palpable 2/4, CRT < 3 sec to all digits.  Protective sensation intact. Motor function intact.  Right foot: Incision is well coapted without any evidence of dehiscence and sutures are intact. Incision appears to be healing well. There is no surrounding erythema, ascending cellulitis, fluctuance, crepitus, malodor, drainage/purulence. There is mild edema around the surgical site. There is no pain along the surgical site. There is significant improvement in the prominence that she was having prior to surgery she is happy with the outcome so far. She is happy with the outcome so far.  No other areas of tenderness to bilateral lower extremities.  No other open lesions or pre-ulcerative lesions.  No pain with calf compression, swelling, warmth, erythema.   Assessment and Plan:  Status post right foot surgery, doing well with no complications   -Treatment options discussed including all alternatives, risks, and complications -The incision appears to be healing well. The sutures were removed today without complications after removal the incision remained coapted. Steri-Strips were applied for reinforcement followed by antibiotic ointment and a bandage. She can start to shower in 2 days however I want her to keep antibiotic ointment and a bandage over  the incisions daily. -Slowly transition into a regular shoe as tolerated -Ice and elevation -She is not taking any pain medications -Monitor for any clinical signs or symptoms of infection and directed to call the office immediately should any occur or go to the ER. -RTC 2 weeks or sooner if needed.  Ovid Curd, DPM

## 2017-05-18 ENCOUNTER — Ambulatory Visit
Admission: RE | Admit: 2017-05-18 | Discharge: 2017-05-18 | Disposition: A | Discharge status: Home / Self Care | Payer: 59 | Source: Ambulatory Visit | Attending: Family Medicine | Admitting: Family Medicine

## 2017-05-18 DIAGNOSIS — Z1231 Encounter for screening mammogram for malignant neoplasm of breast: Secondary | ICD-10-CM

## 2017-05-22 ENCOUNTER — Other Ambulatory Visit: Payer: Self-pay | Admitting: Family Medicine

## 2017-05-22 DIAGNOSIS — R928 Other abnormal and inconclusive findings on diagnostic imaging of breast: Secondary | ICD-10-CM

## 2017-05-30 ENCOUNTER — Ambulatory Visit
Admission: RE | Admit: 2017-05-30 | Discharge: 2017-05-30 | Disposition: A | Discharge status: Home / Self Care | Payer: 59 | Source: Ambulatory Visit | Attending: Family Medicine | Admitting: Family Medicine

## 2017-05-30 ENCOUNTER — Ambulatory Visit (INDEPENDENT_AMBULATORY_CARE_PROVIDER_SITE_OTHER): Payer: 59

## 2017-05-30 ENCOUNTER — Ambulatory Visit (INDEPENDENT_AMBULATORY_CARE_PROVIDER_SITE_OTHER): Payer: 59 | Admitting: Podiatry

## 2017-05-30 ENCOUNTER — Encounter: Payer: Self-pay | Admitting: Podiatry

## 2017-05-30 DIAGNOSIS — M898X9 Other specified disorders of bone, unspecified site: Secondary | ICD-10-CM

## 2017-05-30 DIAGNOSIS — R928 Other abnormal and inconclusive findings on diagnostic imaging of breast: Secondary | ICD-10-CM

## 2017-05-31 NOTE — Progress Notes (Signed)
Subjective: Otis BraceDeborah G Wilson is a 66 y.o. is seen today in office s/p right foot dorsal exostectomy and 5th metatarsal head excision preformed on 04/26/2017 for POV #3.  She states that overall she is doing well. She states that she occasionally gets some soreness but this is to be expected. She denies any significant pain and she is taking no pain medication for this. She states this one is also improved. She's been wearing a regular shoe, although loosely. She denies any increase in warmth or redness or feet. She denies any recent injury or trauma. She has no other concerns today. She states that she is very pleased with the outcome of the surgery thus far.  Denies any systemic complaints such as fevers, chills, nausea, vomiting. No calf pain, chest pain, shortness of breath.   Objective: General: No acute distress, AAOx3  DP/PT pulses palpable 2/4, CRT < 3 sec to all digits.  Protective sensation intact. Motor function intact.  Right foot: Incision is well coapted without any evidence of dehiscence and scars have formed. There is mild swelling along the surgical sites however this appears to be improved. There is no sciatic and erythema, ascending cellulitis. There is no fluctuance or crepitus. There is no malodor. There is no conical signs of infection present. There is no tenderness palpation of the surgical sites. No other areas of tenderness to bilateral lower extremities.  No other open lesions or pre-ulcerative lesions.  No pain with calf compression, swelling, warmth, erythema.   Assessment and Plan:  Status post right foot surgery, doing well with no complications   -Treatment options discussed including all alternatives, risks, and complications -She is doing well. I want her to continue to gradually increase activity level as tolerated in a regular shoe. Dispensed a compression anklet to help with swelling. Continue ice and elevation as well. She is not taking any pain medication. I'll  follow-up with her in 6 weeks she is having issues or sooner if any palms are to arise. Otherwise I'll follow-up with her as needed as she is doing well but there is any concerns or questions along the way to call the office. She agrees to this.   Ovid CurdMatthew Alfard Cochrane, DPM

## 2017-07-11 ENCOUNTER — Ambulatory Visit: Payer: 59 | Admitting: Podiatry

## 2017-09-07 ENCOUNTER — Other Ambulatory Visit (HOSPITAL_COMMUNITY)
Admission: RE | Admit: 2017-09-07 | Discharge: 2017-09-07 | Disposition: A | Discharge status: Home / Self Care | Payer: 59 | Source: Ambulatory Visit | Attending: Family Medicine | Admitting: Family Medicine

## 2017-09-07 ENCOUNTER — Other Ambulatory Visit: Payer: Self-pay | Admitting: Family Medicine

## 2017-09-07 DIAGNOSIS — Z01419 Encounter for gynecological examination (general) (routine) without abnormal findings: Secondary | ICD-10-CM | POA: Insufficient documentation

## 2017-09-11 LAB — CYTOLOGY - PAP: DIAGNOSIS: NEGATIVE

## 2019-05-14 ENCOUNTER — Encounter: Payer: Self-pay | Admitting: Gastroenterology

## 2019-06-04 ENCOUNTER — Encounter: Payer: Self-pay | Admitting: Gastroenterology
